# Patient Record
Sex: Female | Born: 1960 | Hispanic: Yes | Marital: Married | Smoking: Former smoker
Health system: Southern US, Community
[De-identification: ages and names within clinical notes are randomized; demographics above are authoritative.]

## PROBLEM LIST (undated history)

## (undated) DIAGNOSIS — F419 Anxiety disorder, unspecified: Secondary | ICD-10-CM

## (undated) DIAGNOSIS — E119 Type 2 diabetes mellitus without complications: Secondary | ICD-10-CM

## (undated) DIAGNOSIS — I10 Essential (primary) hypertension: Secondary | ICD-10-CM

## (undated) HISTORY — PX: BREAST SURGERY: SHX581

## (undated) HISTORY — DX: Anxiety disorder, unspecified: F41.9

## (undated) HISTORY — DX: Type 2 diabetes mellitus without complications: E11.9

## (undated) HISTORY — DX: Essential (primary) hypertension: I10

---

## 2020-06-05 HISTORY — PX: MASTECTOMY: SHX3

## 2021-08-16 ENCOUNTER — Encounter: Payer: Self-pay | Admitting: Nurse Practitioner

## 2021-08-16 ENCOUNTER — Other Ambulatory Visit (HOSPITAL_COMMUNITY): Payer: Self-pay

## 2021-08-16 ENCOUNTER — Ambulatory Visit (INDEPENDENT_AMBULATORY_CARE_PROVIDER_SITE_OTHER): Payer: Self-pay | Admitting: Nurse Practitioner

## 2021-08-16 ENCOUNTER — Other Ambulatory Visit: Payer: Self-pay

## 2021-08-16 VITALS — BP 123/71 | HR 73 | Temp 97.9°F | Ht 62.0 in | Wt 187.6 lb

## 2021-08-16 DIAGNOSIS — E7849 Other hyperlipidemia: Secondary | ICD-10-CM

## 2021-08-16 DIAGNOSIS — Z0000 Encounter for general adult medical examination without abnormal findings: Secondary | ICD-10-CM

## 2021-08-16 DIAGNOSIS — I10 Essential (primary) hypertension: Secondary | ICD-10-CM

## 2021-08-16 DIAGNOSIS — E119 Type 2 diabetes mellitus without complications: Secondary | ICD-10-CM

## 2021-08-16 DIAGNOSIS — G4709 Other insomnia: Secondary | ICD-10-CM

## 2021-08-16 DIAGNOSIS — Z853 Personal history of malignant neoplasm of breast: Secondary | ICD-10-CM

## 2021-08-16 LAB — POCT URINALYSIS DIP (CLINITEK)
Bilirubin, UA: NEGATIVE
Blood, UA: NEGATIVE
Glucose, UA: NEGATIVE mg/dL
Ketones, POC UA: NEGATIVE mg/dL
Leukocytes, UA: NEGATIVE
Nitrite, UA: NEGATIVE
POC PROTEIN,UA: NEGATIVE
Spec Grav, UA: 1.02 (ref 1.010–1.025)
Urobilinogen, UA: 0.2 E.U./dL
pH, UA: 6.5 (ref 5.0–8.0)

## 2021-08-16 LAB — POCT GLYCOSYLATED HEMOGLOBIN (HGB A1C)
HbA1c POC (<> result, manual entry): 6.2 % (ref 4.0–5.6)
HbA1c, POC (controlled diabetic range): 6.2 % (ref 0.0–7.0)
HbA1c, POC (prediabetic range): 6.2 % (ref 5.7–6.4)
Hemoglobin A1C: 6.2 % — AB (ref 4.0–5.6)

## 2021-08-16 MED ORDER — ATORVASTATIN CALCIUM 40 MG PO TABS
40.0000 mg | ORAL_TABLET | Freq: Every day | ORAL | 2 refills | Status: AC
  Filled 2021-08-16: qty 30, 30d supply, fill #0

## 2021-08-16 MED ORDER — HYDROCHLOROTHIAZIDE 12.5 MG PO TABS
12.5000 mg | ORAL_TABLET | Freq: Every day | ORAL | 2 refills | Status: AC
  Filled 2021-08-16: qty 30, 30d supply, fill #0

## 2021-08-16 MED ORDER — TRAZODONE HCL 100 MG PO TABS
100.0000 mg | ORAL_TABLET | Freq: Every day | ORAL | 2 refills | Status: AC
  Filled 2021-08-16: qty 30, 30d supply, fill #0

## 2021-08-16 MED ORDER — METFORMIN HCL 1000 MG PO TABS
1000.0000 mg | ORAL_TABLET | Freq: Two times a day (BID) | ORAL | 2 refills | Status: AC
  Filled 2021-08-16: qty 60, 30d supply, fill #0

## 2021-08-16 MED ORDER — CAPTOPRIL 25 MG PO TABS
25.0000 mg | ORAL_TABLET | Freq: Three times a day (TID) | ORAL | 2 refills | Status: DC
Start: 2021-08-16 — End: 2021-08-17
  Filled 2021-08-16: qty 90, 30d supply, fill #0

## 2021-08-16 NOTE — Patient Instructions (Signed)
You were seen today in the [facility] to establish care and for wellness visit. Labs were collected, results will be available via MyChart or, if abnormal, you will be contacted by clinic staff. You were prescribed medications, please take as directed. Please follow up in 3 mths for reevaluation of chronic illness.  ?

## 2021-08-16 NOTE — Progress Notes (Signed)
? ?[place] ?[place], [place]  [address] ?Phone:  [phone]   Fax:  [phone] ?Subjective:  ? Patient ID: Amber Whitaker, female    DOB: Apr 11, 1961, 61 y.o.   MRN: [id number] ? ?Chief Complaint  ?Patient presents with  ? Establish Care  ?  Pt is here to establish care. Pt stated she had mastectomy done 6 months ago in [place]   ? ?HPI ?Amber Whitaker 61 y.o. female  has a past medical history of Anxiety, Diabetes mellitus without complication ([place]), and Hypertension. History of breast cancer with completion of mastectomy 6 mths ago. To the [facility] to establish care, recently moved to the area from [place] 2-3 wks. Last visit with PCP 1-2 mths ago.  ? ?States that she moved to area for work and benefits of current job. 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 Denies any completion of radiation or chemotherapy after mastectomy.  ? ?Currently compliant with all medications. Checks B/P regularly at home, typically 120-140/80. States that she monitors meals and exercises regularly, walking daily. Requesting referral for oncologist. States that she has had difficulty finding an oncologist with spanish accommodations. Verbalizes also being due for mammogram, requesting referral. Denies any other concerns today.  ? ?Denies any fever. Denies any fatigue, chest pain, shortness of breath, HA or dizziness. Denies any blurred vision, numbness or tingling. ? ?Past Medical History:  ?Diagnosis Date  ? Anxiety   ? Diabetes mellitus without complication ([place])   ? Hypertension   ? ? ?History reviewed. No pertinent surgical history. ? ?Family History  ?Problem Relation Age of Onset  ? Cancer Sister   ? Cancer Brother   ? ? ?Social History  ? ?Socioeconomic History  ? Marital status: Married  ?  Spouse name: Not on file  ? Number of children: Not on file  ? Years of education: Not on file  ? Highest education level: Not on file   ?Occupational History  ? Not on file  ?Tobacco Use  ? Smoking status: Never  ? Smokeless tobacco: Never  ?Vaping Use  ? Vaping Use: Never used  ?Substance and Sexual Activity  ? Alcohol use: Not on file  ? Drug use: Never  ? Sexual activity: Not Currently  ?Other Topics Concern  ? Not on file  ?Social History Narrative  ? Not on file  ? ?Social Determinants of Health  ? ?Financial Resource Strain: Not on file  ?Food Insecurity: Not on file  ?Transportation Needs: Not on file  ?Physical Activity: Not on file  ?Stress: Not on file  ?Social Connections: Not on file  ?Intimate Partner Violence: Not on file  ? ? ?Outpatient Medications Prior to Visit  ?Medication Sig Dispense Refill  ? atorvastatin (LIPITOR) 40 MG tablet Take 40 mg by mouth daily.    ? captopril (CAPOTEN) 25 MG tablet Take 25 mg by mouth 3 (three) times daily.    ? hydrochlorothiazide (HYDRODIURIL) 12.5 MG tablet Take 12.5 mg by mouth daily.    ? metFORMIN (GLUCOPHAGE) 1000 MG tablet Take 1,000 mg by mouth 2 (two) times daily with a meal.    ? traZODone (DESYREL) 100 MG tablet Take 100 mg by mouth at bedtime.    ? ?No facility-administered medications prior to visit.  ? ? ?Not on File ? ?Review of Systems  ?Constitutional:  Negative for chills, fever and malaise/fatigue.  ?HENT: Negative.    ?Eyes: Negative.   ?Respiratory:  Negative for cough and shortness of breath.   ?Cardiovascular:  Negative for chest pain, palpitations and leg swelling.  ?Gastrointestinal:  Negative for abdominal pain, blood in stool, constipation, diarrhea, nausea and vomiting.  ?Genitourinary: Negative.   ?Musculoskeletal: Negative.   ?Skin: Negative.   ?Neurological: Negative.   ?Psychiatric/Behavioral:  Negative for depression. The patient is not nervous/anxious.   ?All other systems reviewed and are negative. ? ?   ?Objective:  ?  ?Physical Exam ?Vitals reviewed.  ?Constitutional:   ?   General: She is not in acute distress. ?   Appearance: Normal appearance. She is normal  weight.  ?HENT:  ?   Head: Normocephalic.  ?   Right Ear: Tympanic membrane, ear canal and external ear normal.  ?   Left Ear: Tympanic membrane, ear canal and external ear normal.  ?   Nose: Nose normal. No congestion or rhinorrhea.  ?   Mouth/Throat:  ?   Mouth: Mucous membranes are moist.  ?   Pharynx: Oropharynx is clear. No oropharyngeal exudate or posterior oropharyngeal erythema.  ?Eyes:  ?   General: No scleral icterus.    ?   Right eye: No discharge.     ?   Left eye: No discharge.  ?   Extraocular Movements: Extraocular movements intact.  ?   Conjunctiva/sclera: Conjunctivae normal.  ?   Pupils: Pupils are equal, round, and reactive to light.  ?Neck:  ?   Vascular: No carotid bruit.  ?Cardiovascular:  ?   Rate and Rhythm: Normal rate and regular rhythm.  ?   Pulses: Normal pulses.  ?   Heart sounds: Normal heart sounds.  ?   Comments: No obvious peripheral edema ?Pulmonary:  ?   Effort: Pulmonary effort is normal.  ?   Breath sounds: Normal breath sounds.  ?Chest:  ?Breasts: ?   Right: Normal. No swelling, bleeding, inverted nipple, mass, nipple discharge, skin change or tenderness.  ?   Left: Absent.  ?   Comments: Overall breast exam wnl ?Abdominal:  ?   General: Abdomen is flat. Bowel sounds are normal. There is no distension.  ?   Palpations: Abdomen is soft. There is no mass.  ?   Tenderness: There is no abdominal tenderness. There is no right CVA tenderness, left CVA tenderness, guarding or rebound.  ?   Hernia: No hernia is present.  ?Musculoskeletal:     ?   General: No swelling, tenderness, deformity or signs of injury. Normal range of motion.  ?   Cervical back: Normal range of motion and neck supple. No rigidity or tenderness.  ?   Right lower leg: No edema.  ?   Left lower leg: No edema.  ?Lymphadenopathy:  ?   Cervical: No cervical adenopathy.  ?Skin: ?   General: Skin is warm and dry.  ?   Capillary Refill: Capillary refill takes less than 2 seconds.  ?Neurological:  ?   General: No focal  deficit present.  ?   Mental Status: She is alert and oriented to person, place, and time.  ?Psychiatric:     ?   Mood and Affect: Mood normal.     ?   Behavior: Behavior normal.     ?   Thought Content: Thought content normal.     ?   Judgment: Judgment normal.  ? ? ?BP 123/71   Pulse 73   Temp 97.9 ?F (36.6 ?C)   Ht 5' 2"  (1.575 m)   Wt 187 lb  9.6 oz (85.1 kg)   SpO2 100%   BMI 34.31 kg/m?  ?Wt Readings from Last 3 Encounters:  ?08/16/21 187 lb 9.6 oz (85.1 kg)  ? ? ?Immunization History  ?Administered Date(s) Administered  ? Unspecified SARS-COV-2 Vaccination 10/15/2019  ? ? ?Diabetic Foot Exam - Simple   ?No data filed ?  ? ? ?No results found for: TSH ?No results found for: WBC, HGB, HCT, MCV, PLT ?No results found for: NA, K, CHLORIDE, CO2, GLUCOSE, BUN, CREATININE, BILITOT, ALKPHOS, AST, ALT, PROT, ALBUMIN, CALCIUM, ANIONGAP, EGFR, GFR ?No results found for: CHOL ?No results found for: HDL ?No results found for: [place] ?No results found for: TRIG ?No results found for: CHOLHDL ?Lab Results  ?Component Value Date  ? HGBA1C 6.2 (A) 08/16/2021  ? HGBA1C 6.2 08/16/2021  ? HGBA1C 6.2 08/16/2021  ? HGBA1C 6.2 08/16/2021  ? ? ?   ?Assessment & Plan:  ? ?Problem List Items Addressed This Visit   ?None ?Visit Diagnoses   ? ? History of breast cancer    -  Primary  ? Relevant Orders  ? Ambulatory referral to Gynecologic Oncology  ? POCT glycosylated hemoglobin (Hb A1C) (Completed): 6.2, at goal   ? POCT URINALYSIS DIP (CLINITEK) (Completed): wnl  ? MM Digital Diagnostic Bilat  ? Encounter for wellness examination in adult      ? Relevant Orders  ? POCT glycosylated hemoglobin (Hb A1C) (Completed)  ? POCT URINALYSIS DIP (CLINITEK) (Completed)  ? CBC with Differential/Platelet  ? Comprehensive metabolic panel  ? Lipid panel ?Encouraged continued diet and exercise efforts  ?Encouraged continued compliance with medication  ?  ? Primary hypertension      ? Relevant Medications  ? atorvastatin (LIPITOR) 40 MG tablet   ? captopril (CAPOTEN) 25 MG tablet  ? hydrochlorothiazide (HYDRODIURIL) 12.5 MG tablet  ? Other Relevant Orders  ? CBC with Differential/Platelet  ? Comprehensive metabolic panel  ? Lipid panel ?Encouraged continued d

## 2021-08-17 ENCOUNTER — Other Ambulatory Visit (HOSPITAL_COMMUNITY): Payer: Self-pay

## 2021-08-17 ENCOUNTER — Other Ambulatory Visit: Payer: Self-pay | Admitting: Nurse Practitioner

## 2021-08-17 DIAGNOSIS — I10 Essential (primary) hypertension: Secondary | ICD-10-CM

## 2021-08-17 LAB — COMPREHENSIVE METABOLIC PANEL
ALT: 31 IU/L (ref 0–32)
AST: 27 IU/L (ref 0–40)
Albumin/Globulin Ratio: 2.1 (ref 1.2–2.2)
Albumin: 4.8 g/dL (ref 3.8–4.9)
Alkaline Phosphatase: 138 IU/L — ABNORMAL HIGH (ref 44–121)
BUN/Creatinine Ratio: 19 (ref 12–28)
BUN: 13 mg/dL (ref 8–27)
Bilirubin Total: 0.3 mg/dL (ref 0.0–1.2)
CO2: 24 mmol/L (ref 20–29)
Calcium: 9.4 mg/dL (ref 8.7–10.3)
Chloride: 102 mmol/L (ref 96–106)
Creatinine, Ser: 0.68 mg/dL (ref 0.57–1.00)
Globulin, Total: 2.3 g/dL (ref 1.5–4.5)
Glucose: 92 mg/dL (ref 70–99)
Potassium: 4.5 mmol/L (ref 3.5–5.2)
Sodium: 140 mmol/L (ref 134–144)
Total Protein: 7.1 g/dL (ref 6.0–8.5)
eGFR: 100 mL/min/{1.73_m2} (ref >59)

## 2021-08-17 LAB — CBC WITH DIFFERENTIAL/PLATELET
Basophils Absolute: 0 10*3/uL (ref 0.0–0.2)
Basos: 1 %
EOS (ABSOLUTE): 0.2 10*3/uL (ref 0.0–0.4)
Eos: 3 %
Hematocrit: 39.3 % (ref 34.0–46.6)
Hemoglobin: 13.3 g/dL (ref 11.1–15.9)
Immature Grans (Abs): 0 10*3/uL (ref 0.0–0.1)
Immature Granulocytes: 0 %
Lymphocytes Absolute: 2.1 10*3/uL (ref 0.7–3.1)
Lymphs: 37 %
MCH: 28.9 pg (ref 26.6–33.0)
MCHC: 33.8 g/dL (ref 31.5–35.7)
MCV: 85 fL (ref 79–97)
Monocytes Absolute: 0.5 10*3/uL (ref 0.1–0.9)
Monocytes: 8 %
Neutrophils Absolute: 3 10*3/uL (ref 1.4–7.0)
Neutrophils: 51 %
Platelets: 238 10*3/uL (ref 150–450)
RBC: 4.61 x10E6/uL (ref 3.77–5.28)
RDW: 13.1 % (ref 11.7–15.4)
WBC: 5.8 10*3/uL (ref 3.4–10.8)

## 2021-08-17 LAB — LIPID PANEL
Chol/HDL Ratio: 4.6 ratio — ABNORMAL HIGH (ref 0.0–4.4)
Cholesterol, Total: 185 mg/dL (ref 100–199)
HDL: 40 mg/dL (ref >39)
LDL Chol Calc (NIH): 122 mg/dL — ABNORMAL HIGH (ref 0–99)
Triglycerides: 129 mg/dL (ref 0–149)
VLDL Cholesterol Cal: 23 mg/dL (ref 5–40)

## 2021-08-17 MED ORDER — LISINOPRIL 40 MG PO TABS
40.0000 mg | ORAL_TABLET | Freq: Every day | ORAL | 3 refills | Status: AC
  Filled 2021-08-17: qty 90, 90d supply, fill #0

## 2021-08-17 MED ORDER — CAPTOPRIL 25 MG PO TABS
25.0000 mg | ORAL_TABLET | Freq: Three times a day (TID) | ORAL | 2 refills | Status: DC
  Filled 2021-08-17: qty 90, 30d supply, fill #0

## 2021-08-18 ENCOUNTER — Telehealth: Payer: Self-pay | Admitting: Clinical

## 2021-08-18 ENCOUNTER — Telehealth: Payer: Self-pay | Admitting: Hematology and Oncology

## 2021-08-18 NOTE — Telephone Encounter (Signed)
Scheduled appt per 3/14 referral. Pt is aware of appt date and time. Pt is aware to arrive 15 mins prior to appt time and to bring and updated insurance card. Pt is aware of appt location.   ?

## 2021-08-18 NOTE — Telephone Encounter (Signed)
[facility] ?Case Management Referral Note ? ?08/18/2021 ?Name: [name] MRN: [id number] DOB: 1960-08-05 ?Amber Whitaker is a 61 y.o. year old female who sees No primary care provider on file. for primary care. LCSW was consulted to assess patient's needs and assist the patient with Financial Difficulties related to low income and lack of health coverage . ? ?Interpreter: Yes.     Interpreter Name & Language: Spanish [phone] ? ?Assessment: Patient experiencing Financial Difficulties related to low income and lack of health coverage.  ? ?Intervention: CSW called patient to follow up on patient's visit with PCP 08/16/21. Patient is uninsured and has low income. She has been living in [place] for six months. Reviewed [employer] and [employer]. Advised patient on supporting documents to be submitted with the application. Advised patient to follow up with CSW for assistance in scheduling appointment with financial counselor at [facility]) to submit the application. Provided CSW contact information. Also reviewed mammogram scholarship application and instructions on submitting, as patient is in need of a mammogram. ? ?Review of patient status, including review of consultants reports, relevant laboratory and other test results, and collaboration with appropriate care team members and the patient's provider was performed as part of comprehensive patient evaluation and provision of services.   ? ?Abigail Butts, LCSW ?Patient [facility] Center ?[facility] ?[phone] ?  ? ?

## 2021-08-26 ENCOUNTER — Other Ambulatory Visit (HOSPITAL_COMMUNITY): Payer: Self-pay

## 2021-09-05 NOTE — Progress Notes (Signed)
[facility] ?CONSULT NOTE ? ?No care team member to display ? ?CHIEF COMPLAINTS/PURPOSE OF CONSULTATION:  ?Newly diagnosed breast cancer High risk ? ?HISTORY OF PRESENTING ILLNESS:  ?Amber Whitaker 61 y.o. female is here because of recent diagnosis of left High risk breast cancer. She presents to the clinic today for a consult.  She was diagnosed with left breast DCIS about a year ago in [place].  She underwent left mastectomy.  Because she was ER/PR negative there was no role for antiestrogen therapy.  She moved to [facility] for work and is here today to establish oncology care with [place].  She reports no new pain or discomfort. ? ?I reviewed her records extensively and collaborated the history with the patient. ? ?MEDICAL HISTORY:  ?Past Medical History:  ?Diagnosis Date  ? Anxiety   ? Diabetes mellitus without complication ([place])   ? Hypertension   ? ? ?SURGICAL HISTORY: ?No past surgical history on file. ? ?SOCIAL HISTORY: ?Social History  ? ?Socioeconomic History  ? Marital status: Married  ?  Spouse name: Not on file  ? Number of children: Not on file  ? Years of education: Not on file  ? Highest education level: Not on file  ?Occupational History  ? Not on file  ?Tobacco Use  ? Smoking status: Never  ? Smokeless tobacco: Never  ?Vaping Use  ? Vaping Use: Never used  ?Substance and Sexual Activity  ? Alcohol use: Not on file  ? Drug use: Never  ? Sexual activity: Not Currently  ?Other Topics Concern  ? Not on file  ?Social History Narrative  ? Not on file  ? ?Social Determinants of Health  ? ?Financial Resource Strain: Not on file  ?Food Insecurity: Not on file  ?Transportation Needs: Not on file  ?Physical Activity: Not on file  ?Stress: Not on file  ?Social Connections: Not on file  ?Intimate Partner Violence: Not on file  ? ? ?FAMILY HISTORY: ?Family History  ?Problem Relation Age of Onset  ? Cancer Sister   ? Cancer Brother   ? ? ?ALLERGIES:  has no allergies on  file. ? ?MEDICATIONS:  ?Current Outpatient Medications  ?Medication Sig Dispense Refill  ? atorvastatin (LIPITOR) 40 MG tablet Take 1 tablet (40 mg total) by mouth daily. 30 tablet 2  ? hydrochlorothiazide (HYDRODIURIL) 12.5 MG tablet Take 1 tablet (12.5 mg total) by mouth daily. 30 tablet 2  ? lisinopril (ZESTRIL) 40 MG tablet Take 1 tablet (40 mg total) by mouth daily. 90 tablet 3  ? metFORMIN (GLUCOPHAGE) 1000 MG tablet Take 1 tablet (1,000 mg total) by mouth 2 (two) times daily with a meal. 60 tablet 2  ? traZODone (DESYREL) 100 MG tablet Take 1 tablet (100 mg total) by mouth at bedtime. 30 tablet 2  ? ?No current facility-administered medications for this visit.  ? ? ?REVIEW OF SYSTEMS:   ?Constitutional: Denies fevers, chills or abnormal night sweats ?Eyes: Denies blurriness of vision, double vision or watery eyes ?Ears, nose, mouth, throat, and face: Denies mucositis or sore throat ?Respiratory: Denies cough, dyspnea or wheezes ?Cardiovascular: Denies palpitation, chest discomfort or lower extremity swelling ?Gastrointestinal:  Denies nausea, heartburn or change in bowel habits ?Skin: Denies abnormal skin rashes ?Lymphatics: Denies new lymphadenopathy or easy bruising ?Neurological:Denies numbness, tingling or new weaknesses ?Behavioral/Psych: Mood is stable, no new changes  ?Breast:  Denies any palpable lumps or discharge ?All other systems were reviewed with the patient and are negative. ? ?PHYSICAL EXAMINATION: ?ECOG  PERFORMANCE STATUS: 1 - Symptomatic but completely ambulatory ? ?Vitals:  ? 09/06/21 1314  ?BP: (!) 151/87  ?Pulse: 73  ?Resp: 18  ?Temp: (!) 97.5 ?F (36.4 ?C)  ?SpO2: 99%  ? ?Filed Weights  ? 09/06/21 1314  ?Weight: 186 lb 14.4 oz (84.8 kg)  ? ? ?GENERAL:alert, no distress and comfortable ?SKIN: skin color, texture, turgor are normal, no rashes or significant lesions ?EYES: normal, conjunctiva are pink and non-injected, sclera clear ?OROPHARYNX:no exudate, no erythema and lips, buccal mucosa,  and tongue normal  ?NECK: supple, thyroid normal size, non-tender, without nodularity ?LYMPH:  no palpable lymphadenopathy in the cervical, axillary or inguinal ?LUNGS: clear to auscultation and percussion with normal breathing effort ?HEART: regular rate & rhythm and no murmurs and no lower extremity edema ?ABDOMEN:abdomen soft, non-tender and normal bowel sounds ?Musculoskeletal:no cyanosis of digits and no clubbing  ?PSYCH: alert & oriented x 3 with fluent speech ?NEURO: no focal motor/sensory deficits ?BREAST: No palpable nodules in right breast or left chest wall or axilla. No palpable axillary or supraclavicular lymphadenopathy (exam performed in the presence of a chaperone)  ? ?LABORATORY DATA:  ?I have reviewed the data as listed ?Lab Results  ?Component Value Date  ? WBC 5.8 08/16/2021  ? HGB 13.3 08/16/2021  ? HCT 39.3 08/16/2021  ? MCV 85 08/16/2021  ? PLT 238 08/16/2021  ? ?Lab Results  ?Component Value Date  ? NA 140 08/16/2021  ? K 4.5 08/16/2021  ? CL 102 08/16/2021  ? CO2 24 08/16/2021  ? ? ?RADIOGRAPHIC STUDIES: ?I have personally reviewed the radiological reports and agreed with the findings in the report. ? ?ASSESSMENT AND PLAN:  ?Ductal carcinoma in situ (DCIS) of left breast ?09/16/2020: Biopsy of 7 cm of pleomorphic calcifications in the left breast: DCIS ?10/15/2020: Myriad my risk: Negative ?11/11/2020: Left mastectomy by Dr. Addison Lank [facility] [place]: Grade 3 DCIS 1.2 cm with comedonecrosis, margins negative, ER less than 1%, PR less than 1% ?She went to [place] to [place] from work.  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 ? ?No role of radiation or antiestrogen therapy ?Significant family history of breast cancer 1 sister died at age 51 from breast cancer another older sister is a breast cancer survivor.  Patient is menopausal and does not use hormone replacement therapy. ?Genetic testing: Negative (she has 4 family members with breast  cancers) ? ?Breast cancer surveillance: ?1.  Mammogram of the right breast: Will be ordered for May ?2. breast exam 09/06/2021: Benign ? ?Return to clinic in 1 year for follow-up ? ? ? ?All questions were answered. The patient knows to call the clinic with any problems, questions or concerns. ?  ? Harriette Ohara, MD ?09/06/21 ? I Gardiner Coins am scribing for Dr. Lindi Adie ? ?I have reviewed the above documentation for accuracy and completeness, and I agree with the above. ?  ?

## 2021-09-06 ENCOUNTER — Inpatient Hospital Stay: Payer: Self-pay | Attending: Hematology and Oncology | Admitting: Hematology and Oncology

## 2021-09-06 ENCOUNTER — Other Ambulatory Visit: Payer: Self-pay

## 2021-09-06 DIAGNOSIS — D0512 Intraductal carcinoma in situ of left breast: Secondary | ICD-10-CM | POA: Insufficient documentation

## 2021-09-06 DIAGNOSIS — Z86000 Personal history of in-situ neoplasm of breast: Secondary | ICD-10-CM | POA: Insufficient documentation

## 2021-09-06 NOTE — Assessment & Plan Note (Signed)
09/16/2020: Biopsy of 7 cm of pleomorphic calcifications in the left breast: DCIS ?10/15/2020: Myriad my risk: Negative ?11/11/2020: Left mastectomy by Dr. Addison Lank [facility] [place]: Grade 3 DCIS 1.2 cm with comedonecrosis, margins negative, ER less than 1%, PR less than 1% ? ?No role of radiation or antiestrogen therapy ?Significant family history of breast cancer 1 sister died at age 60 from breast cancer another older sister is a breast cancer survivor.  Patient is menopausal and does not use hormone replacement therapy. ?Genetic testing: Negative ? ?Breast cancer surveillance: ?1.  Mammogram of the right breast: ?2. breast exam 09/06/2021: Benign ? ?Return to clinic in 1 year for follow-up ? ? ?

## 2021-09-08 ENCOUNTER — Other Ambulatory Visit: Payer: Self-pay | Admitting: *Deleted

## 2021-09-08 ENCOUNTER — Other Ambulatory Visit: Payer: Self-pay

## 2021-09-08 DIAGNOSIS — Z86000 Personal history of in-situ neoplasm of breast: Secondary | ICD-10-CM

## 2021-09-15 ENCOUNTER — Other Ambulatory Visit: Payer: Self-pay | Admitting: *Deleted

## 2021-09-16 ENCOUNTER — Ambulatory Visit: Payer: Self-pay | Admitting: Clinical

## 2021-09-16 DIAGNOSIS — Z5989 Other problems related to housing and economic circumstances: Secondary | ICD-10-CM

## 2021-09-16 NOTE — Progress Notes (Signed)
[facility] ?General Follow Up Note ? ?09/16/2021 ?Name: Sheyli Horwitz MRN: [id number] DOB: [birth year] ?Ladasia Sircy is a 61 y.o. year old female who sees No primary care provider on file. for primary care. LCSW was initially consulted to Financial Difficulties related to low income and lack of health coverage . ? ?Interpreter: No.   Interpreter Name & Language: none ? ?Assessment: Patient experiencing financial difficulties due to lack of health coverage. ? ?Ongoing Intervention: Today CSW and patient met at the [facility]). Patient brought her CAFA application and supporting documents. Reviewed these with patient. CSW to submit.  ? ?SDOH (Social Determinants of Health) assessments performed: No ?  ? ?Review of patient status, including review of consultants reports, relevant laboratory and other test results, and collaboration with appropriate care team members and the patient's provider was performed as part of comprehensive patient evaluation and provision of services.   ? ?Estanislado Emms, LCSW ?Patient [place] ?[place] ?[phone] ?  ? ?

## 2021-10-20 ENCOUNTER — Other Ambulatory Visit: Payer: Self-pay

## 2021-10-20 DIAGNOSIS — Z1231 Encounter for screening mammogram for malignant neoplasm of breast: Secondary | ICD-10-CM

## 2021-10-27 ENCOUNTER — Ambulatory Visit: Payer: Self-pay | Admitting: *Deleted

## 2021-10-27 ENCOUNTER — Ambulatory Visit
Admission: RE | Admit: 2021-10-27 | Discharge: 2021-10-27 | Disposition: A | Discharge status: Home / Self Care | Payer: No Typology Code available for payment source | Source: Ambulatory Visit | Attending: Nurse Practitioner | Admitting: Nurse Practitioner

## 2021-10-27 VITALS — BP 120/82 | Wt 185.7 lb

## 2021-10-27 DIAGNOSIS — Z853 Personal history of malignant neoplasm of breast: Secondary | ICD-10-CM

## 2021-10-27 DIAGNOSIS — Z1239 Encounter for other screening for malignant neoplasm of breast: Secondary | ICD-10-CM

## 2021-10-27 DIAGNOSIS — Z1211 Encounter for screening for malignant neoplasm of colon: Secondary | ICD-10-CM

## 2021-10-27 NOTE — Patient Instructions (Addendum)
Explained breast self awareness with Amber Whitaker. Patient did not need a Pap smear today due to last Pap smear was in March 2022 per patient. Let her know BCCCP will cover Pap smears every 3 years unless has a history of abnormal Pap smears. Referred patient to the [facility] for a screening mammogram on mobile unit. Appointment scheduled Thursday, Oct 27, 2021 at 1300. Patient aware of appointment and will be there. Let patient know the Breast Center will follow up with her within the next couple weeks with results of mammogram by letter or phone. Amber Whitaker verbalized understanding.  Amber Whitaker, Kathaleen Maser, RN 11:31 AM

## 2021-10-27 NOTE — Progress Notes (Signed)
[name] is a 61 y.o. female who presents to [facility] clinic today with no complaints.    Pap Smear: Pap smear not completed today. Last Pap smear was in March 2022 at a clinic in [place] and was normal per patient. Per patient has no history of an abnormal Pap smear. Last Pap smear result is not available in Epic.   Physical exam: Breasts Right breast larger than left breast due to patient has a history of a left breast mastectomy for breast cancer in June 2022. No skin abnormalities bilateral breasts. Right nipple inverted that per patient is normal for her. No nipple discharge right breast.  No lymphadenopathy. No lumps palpated bilateral breasts. No complaints of pain or tenderness on exam.     Pelvic/Bimanual Pap is not indicated today per BCCCP guidelines.   Smoking History: Patient is a former smoker that quit over 10 years ago.   Patient Navigation: Patient education provided. Access to services provided for patient through [facility] program. Spanish interpreter Amber Whitaker from [facility] provided.   Colorectal Cancer Screening: Per patient has never had colonoscopy completed. FIT Test given to patient to complete. No complaints today.    Breast and Cervical Cancer Risk Assessment: Patient has family history of two sisters having breast cancer. Patient has personal history of breast cancer. Patient has no known genetic mutations. Patient stated she had genetic testing completed that was negative. Patient has no history of radiation treatment to the chest before age 5. Patient does not have history of cervical dysplasia, immunocompromised, or DES exposure in-utero.  Risk Assessment     Risk Scores       10/27/2021   Last edited by: Amber Rutherford, LPN   5-year risk: 7.3 %   Lifetime risk: 33.8 %            A: BCCCP exam without pap smear No complaints.  P: Referred patient to the [facility] of [facility] for a screening mammogram on mobile unit.  Appointment scheduled Thursday, Oct 27, 2021 at 1300.  Amber Heidelberg, RN 10/27/2021 11:31 AM

## 2021-11-15 ENCOUNTER — Ambulatory Visit: Payer: Self-pay | Admitting: Nurse Practitioner

## 2022-08-30 NOTE — Progress Notes (Signed)
Patient Care Team: Teena Dunk, NP (Inactive) as PCP - General (Nurse Practitioner)  DIAGNOSIS:  Encounter Diagnosis  Name Primary?   Ductal carcinoma in situ (DCIS) of left breast Yes      CHIEF COMPLIANT:  Breast Cancer Surveillance  INTERVAL HISTORY: Amber Whitaker is a  62 y.o. female is here because of recent diagnosis of left High risk breast cancer. She presents to the clinic today for follow-up. She reports that her health is good. She denies any pain or discomfort in breast. She doesn't get any time for exercise.   ALLERGIES:  has no allergies on file.  MEDICATIONS:  Current Outpatient Medications  Medication Sig Dispense Refill   atorvastatin (LIPITOR) 40 MG tablet Take 1 tablet (40 mg total) by mouth daily. 30 tablet 2   hydrochlorothiazide (HYDRODIURIL) 12.5 MG tablet Take 1 tablet (12.5 mg total) by mouth daily. 30 tablet 2   lisinopril (ZESTRIL) 40 MG tablet Take 1 tablet (40 mg total) by mouth daily. 90 tablet 3   metFORMIN (GLUCOPHAGE) 1000 MG tablet Take 1 tablet (1,000 mg total) by mouth 2 (two) times daily with a meal. 60 tablet 2   traZODone (DESYREL) 100 MG tablet Take 1 tablet (100 mg total) by mouth at bedtime. 30 tablet 2   No current facility-administered medications for this visit.    PHYSICAL EXAMINATION: ECOG PERFORMANCE STATUS: 1 - Symptomatic but completely ambulatory  Vitals:   09/07/22 1404  BP: (!) 156/79  Pulse: 83  Resp: 18  Temp: (!) 97.2 F (36.2 C)  SpO2: 96%   Filed Weights   09/07/22 1404  Weight: 182 lb 14.4 oz (83 kg)    BREAST: No palpable masses or nodules in right breast and left chest wall.  No palpable axillary supraclavicular or infraclavicular adenopathy no breast tenderness or nipple discharge. (exam performed in the presence of a chaperone)  LABORATORY DATA:  I have reviewed the data as listed    Latest Ref Rng & Units 08/16/2021   11:57 AM  CMP  Glucose 70 - 99 mg/dL 92   BUN 8 - 27 mg/dL  13   Creatinine 0.57 - 1.00 mg/dL 0.68   Sodium 134 - 144 mmol/L 140   Potassium 3.5 - 5.2 mmol/L 4.5   Chloride 96 - 106 mmol/L 102   CO2 20 - 29 mmol/L 24   Calcium 8.7 - 10.3 mg/dL 9.4   Total Protein 6.0 - 8.5 g/dL 7.1   Total Bilirubin 0.0 - 1.2 mg/dL 0.3   Alkaline Phos 44 - 121 IU/L 138   AST 0 - 40 IU/L 27   ALT 0 - 32 IU/L 31     Lab Results  Component Value Date   WBC 5.8 08/16/2021   HGB 13.3 08/16/2021   HCT 39.3 08/16/2021   MCV 85 08/16/2021   PLT 238 08/16/2021   NEUTROABS 3.0 08/16/2021    ASSESSMENT & PLAN:  Ductal carcinoma in situ (DCIS) of left breast 09/16/2020: Biopsy of 7 cm of pleomorphic calcifications in the left breast: DCIS 10/15/2020: Myriad my risk: Negative 11/11/2020: Left mastectomy by Dr. Addison Lank [facility] [place]: Grade 3 DCIS 1.2 cm with comedonecrosis, margins negative, ER less than 1%, PR less than 1% She went to [place] to [place] from work.  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   No role of radiation or antiestrogen therapy Significant family history of breast cancer 1 sister died at age 96 from  breast cancer another older sister is a breast cancer survivor.  Patient is menopausal and does not use hormone replacement therapy. Genetic testing: Negative (she has 4 family members with breast cancers)   Breast cancer surveillance: 1.  Mammogram of the right breast: 11/01/2021: Benign breast density category B 2. breast exam 09/07/2022: Benign  She is uninsured and Hispanic and we will request to be set up with BCEP program so that she can get her mammograms covered.   Return to clinic in 1 year for follow-up    No orders of the defined types were placed in this encounter.  The patient has a good understanding of the overall plan. she agrees with it. she will call with any problems that may develop before the next visit here. Total time spent: 30 mins including face to face time and time  spent for planning, charting and co-ordination of care   Harriette Ohara, MD 09/07/22    I Gardiner Coins am acting as a Education administrator for [employer]  I have reviewed the above documentation for accuracy and completeness, and I agree with the above.

## 2022-09-07 ENCOUNTER — Encounter: Payer: Self-pay | Admitting: Hematology and Oncology

## 2022-09-07 ENCOUNTER — Inpatient Hospital Stay: Payer: Self-pay | Attending: Hematology and Oncology | Admitting: Hematology and Oncology

## 2022-09-07 VITALS — BP 156/79 | HR 83 | Temp 97.2°F | Resp 18 | Ht 62.0 in | Wt 182.9 lb

## 2022-09-07 DIAGNOSIS — D0512 Intraductal carcinoma in situ of left breast: Secondary | ICD-10-CM

## 2022-09-07 DIAGNOSIS — Z9012 Acquired absence of left breast and nipple: Secondary | ICD-10-CM | POA: Insufficient documentation

## 2022-09-07 DIAGNOSIS — Z86000 Personal history of in-situ neoplasm of breast: Secondary | ICD-10-CM | POA: Insufficient documentation

## 2022-09-07 DIAGNOSIS — Z803 Family history of malignant neoplasm of breast: Secondary | ICD-10-CM | POA: Insufficient documentation

## 2022-09-07 NOTE — Assessment & Plan Note (Addendum)
09/16/2020: Biopsy of 7 cm of pleomorphic calcifications in the left breast: DCIS 10/15/2020: Myriad my risk: Negative 11/11/2020: Left mastectomy by Dr. Addison Lank [facility] [place]: Grade 3 DCIS 1.2 cm with comedonecrosis, margins negative, ER less than 1%, PR less than 1% She went to [place] to [place] from work.  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   No role of radiation or antiestrogen therapy Significant family history of breast cancer 1 sister died at age 55 from breast cancer another older sister is a breast cancer survivor.  Patient is menopausal and does not use hormone replacement therapy. Genetic testing: Negative (she has 4 family members with breast cancers)   Breast cancer surveillance: 1.  Mammogram of the right breast: 11/01/2021: Benign breast density category B 2. breast exam 09/07/2022: Benign  She is uninsured and Hispanic and we will request to be set up with BCEP program so that she can get her mammograms covered.   Return to clinic in 1 year for follow-up

## 2022-09-07 NOTE — Progress Notes (Signed)
Patient's interpreter called with the patient present to ask about Financial Assistance.  Introduced myself as Arboriculturist. Patient currently does not have an active treatment plan due to having doctor visit today. Advised once treatment plan has been established, I would connect with patient via interpreter to advise what she can apply for as far as treatment should chemotherapy be a part of her treatment plan. If Radiation, there advocates would connect with patient.   Advised all uninsured patients receive an automatic 67% off services billed through [facility]. The Financial Assistance application is to determine if she is eligible for any more than the 67% discount. Advised the application is to be completed and supporting documents submitted with application via mail or can be turned in at cancer center to be mailed as long as all documents are with application to avoid denial which will stand for 6 months if denied.  My card will be provided on day of chemo education class should she be receiving chemotherapy in the future as her treatment plan.  The interpreter verbalized understanding. I advised where to obtain application at cancer center in plastic holders next to side desk for [facility].

## 2022-09-12 ENCOUNTER — Telehealth: Payer: Self-pay

## 2022-09-12 NOTE — Telephone Encounter (Signed)
Telephoned patient at mobile number using interpreter [phone]. Left a voice message with [employer] contact information.

## 2022-10-12 ENCOUNTER — Telehealth: Payer: Self-pay

## 2022-10-12 NOTE — Telephone Encounter (Signed)
Telephoned patient using language line interpreter [phone]. Voice mail not an option.

## 2023-09-13 ENCOUNTER — Ambulatory Visit: Payer: No Typology Code available for payment source | Admitting: Hematology and Oncology

## 2023-09-13 NOTE — Assessment & Plan Note (Deleted)
 09/16/2020: Biopsy of 7 cm of pleomorphic calcifications in the left breast: DCIS 10/15/2020: Myriad my risk: Negative 11/11/2020: Left mastectomy by Dr. Edmund [facility] [place]: Grade 3 DCIS 1.2 cm with comedonecrosis, margins negative, ER less than 1%, PR less than 1% She went to [place] to [place] from work.  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   No role of radiation or antiestrogen therapy Significant family history of breast cancer 1 sister died at age 81 from breast cancer another older sister is a breast cancer survivor.  Patient is menopausal and does not use hormone replacement therapy. Genetic testing: Negative (she has 4 family members with breast cancers)   Breast cancer surveillance: 1.  Mammogram of the right breast: 11/01/2021: Benign breast density category B 2. breast exam 09/13/2023: Benign   She is uninsured and Hispanic and we will request to be set up with BCEP program so that she can get her mammograms covered.   Return to clinic in 1 year for follow-up

## 2024-04-06 IMAGING — MG MM DIGITAL SCREENING UNILAT*R* W/ TOMO W/ CAD
4 series · 4 of 12 positions shown · non-contrast
Comparison: Previous exam(s).

CLINICAL DATA: Screening.

EXAM:
DIGITAL SCREENING UNILATERAL RIGHT MAMMOGRAM WITH CAD AND
TOMOSYNTHESIS
TECHNIQUE: Right screening digital craniocaudal and mediolateral oblique
mammograms were obtained. Right screening digital breast
tomosynthesis was performed. The images were evaluated with
computer-aided detection.

[R MLO synth-2D]
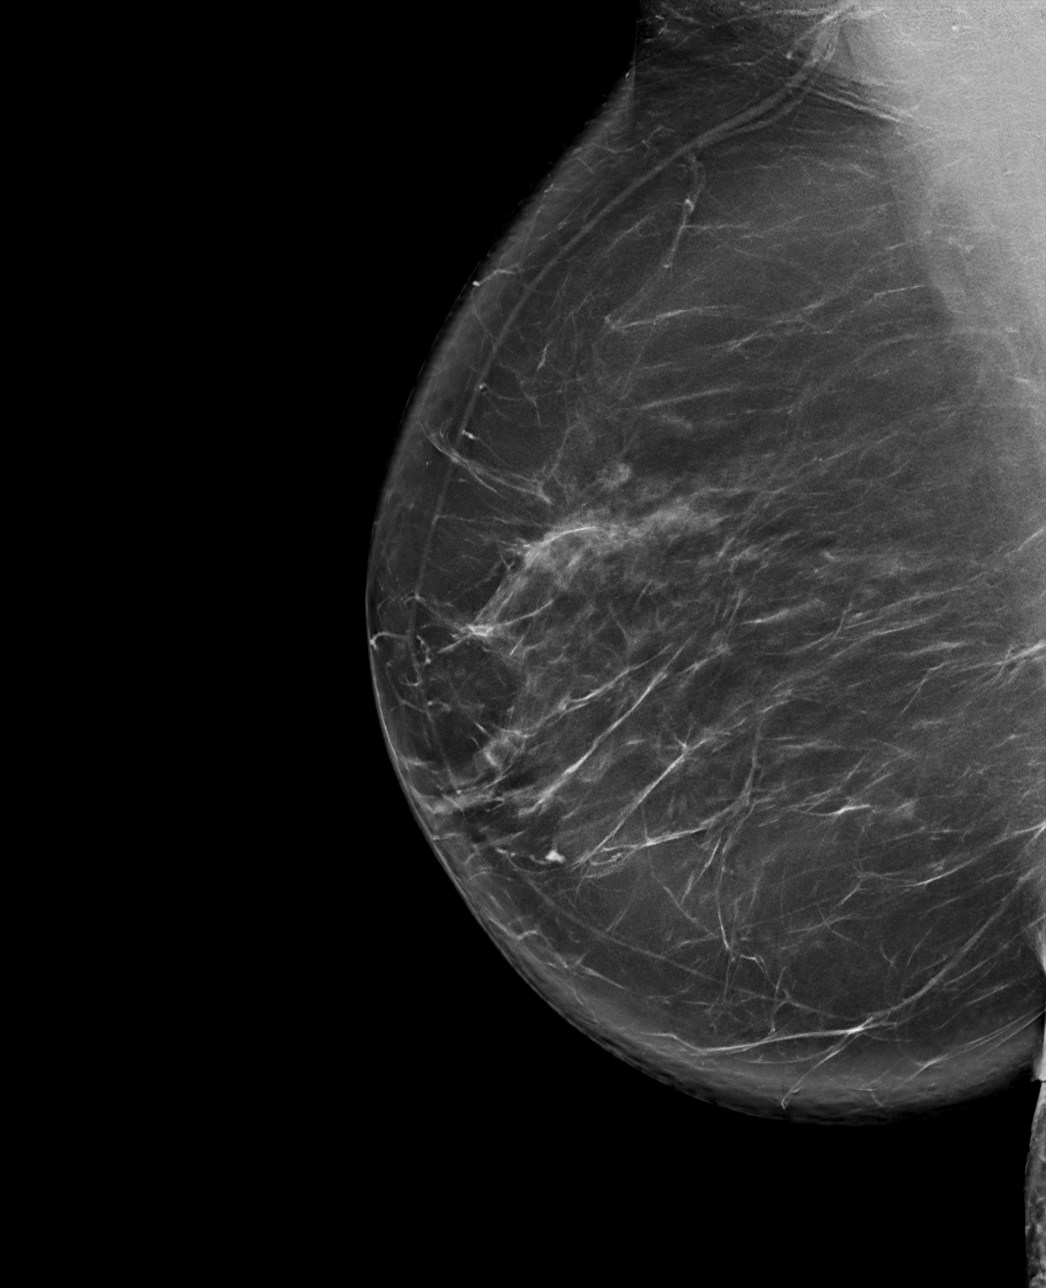

[R CC synth-2D]
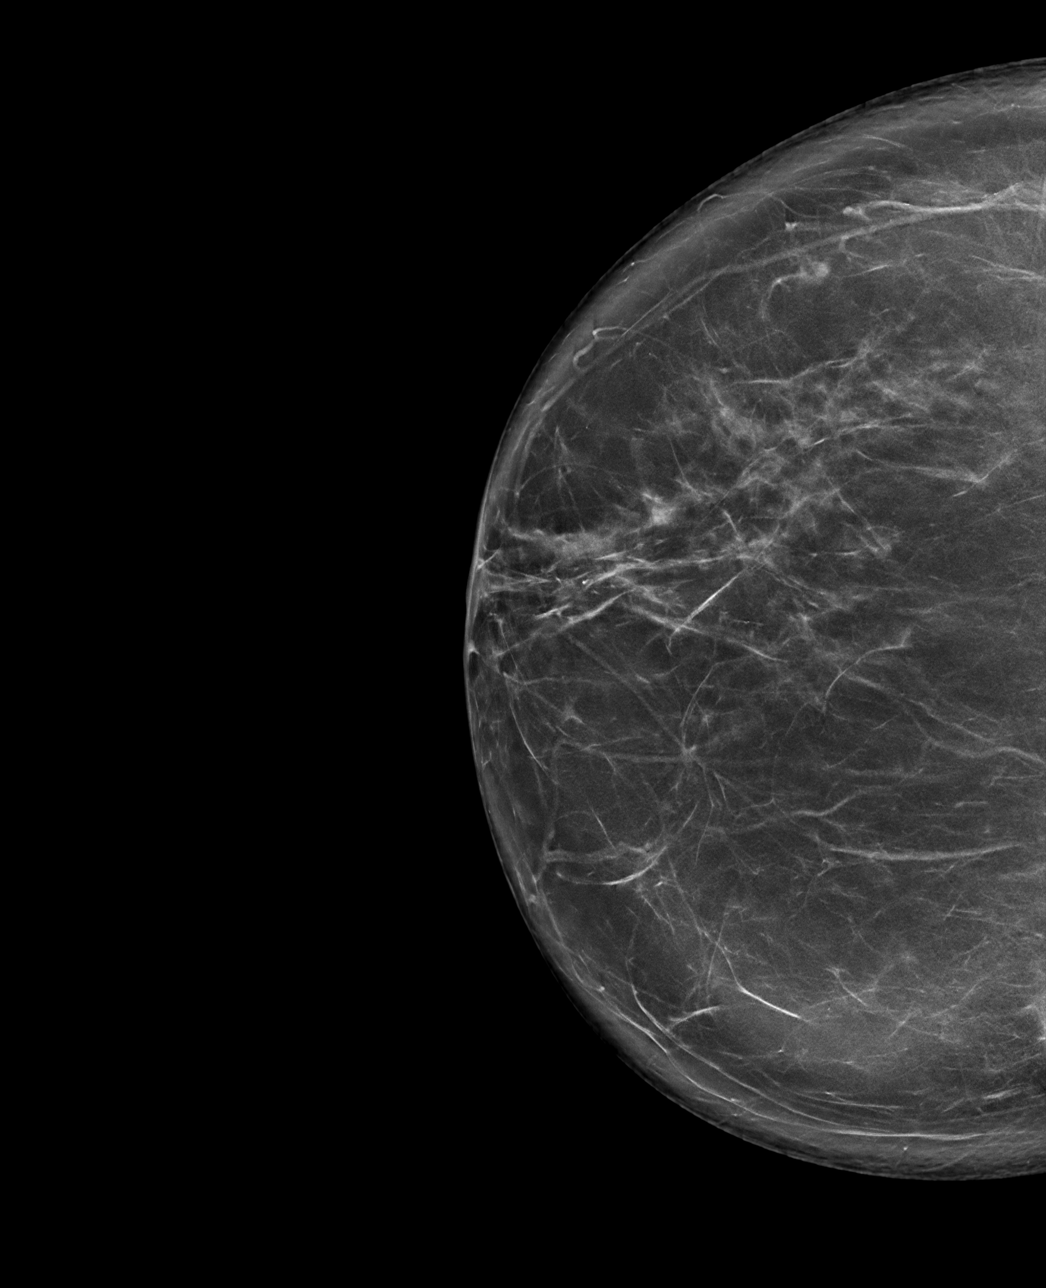

[R MLO tomo · tomo slice 51/101.0]
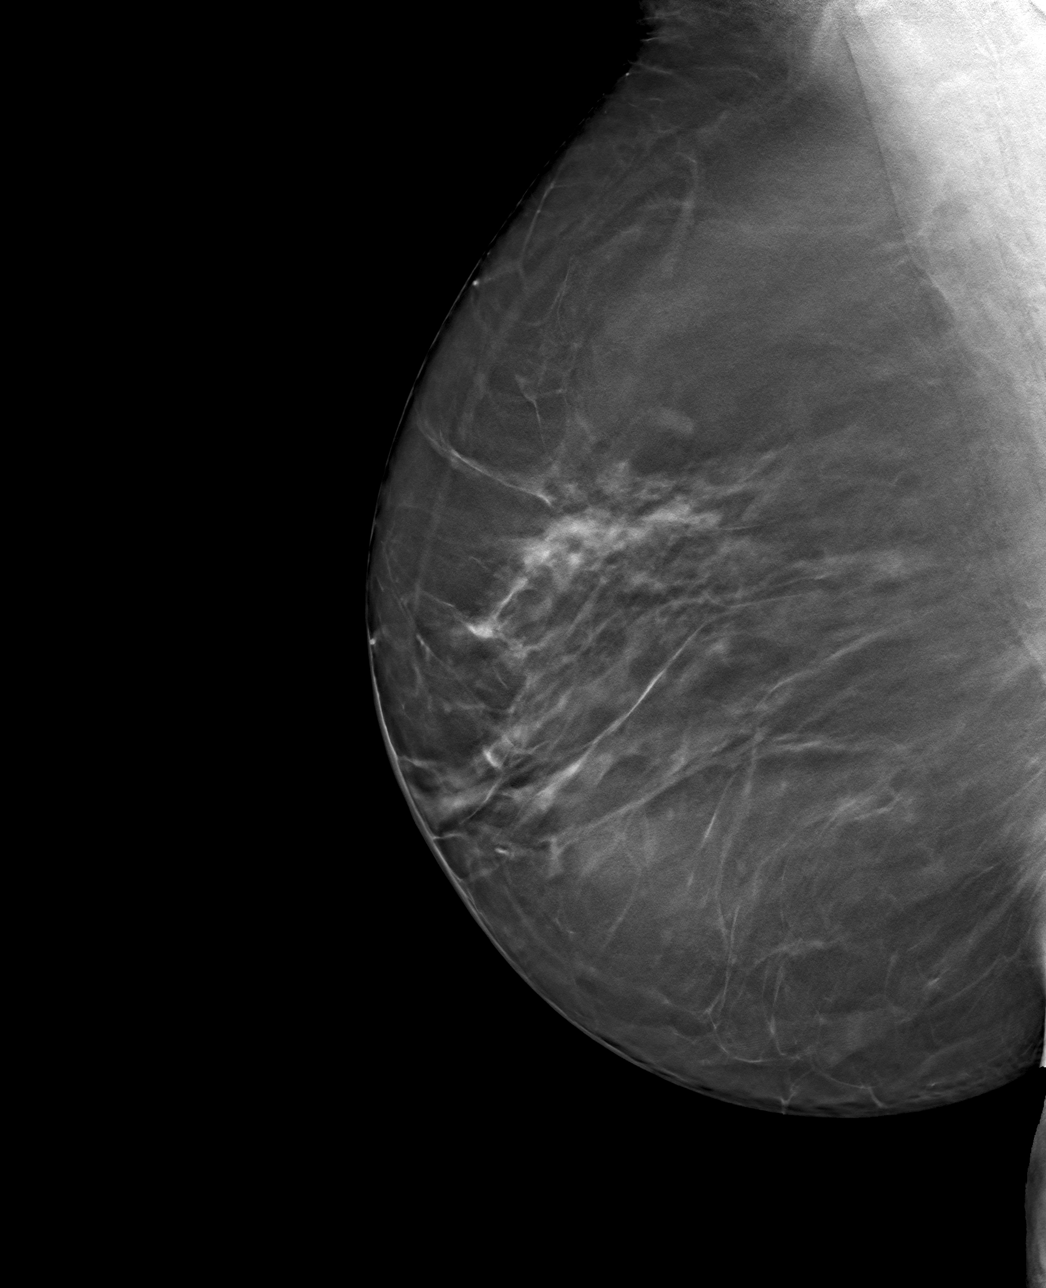

[R CC tomo · tomo slice 47/92.0]
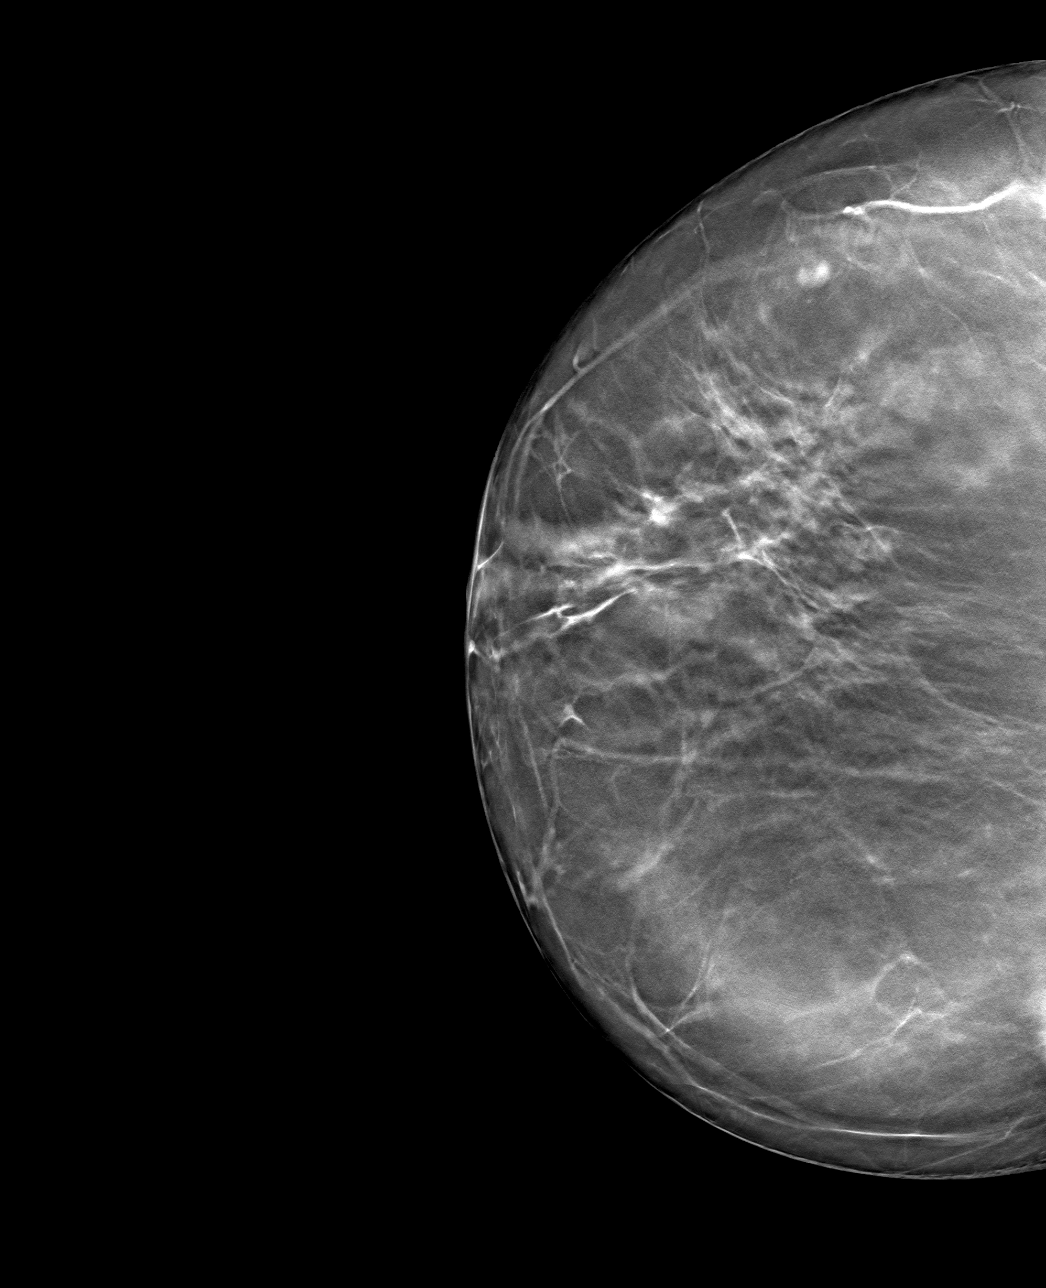

[4 of 12 positions shown; findings below may reference images not displayed]

ACR Breast Density Category b: There are scattered areas of
fibroglandular density.
FINDINGS: There are no findings suspicious for malignancy.
IMPRESSION: No mammographic evidence of malignancy. A result letter of this
screening mammogram will be mailed directly to the patient.

RECOMMENDATION:
Screening mammogram in one year. (Code:C3-I-RW2)

BI-RADS CATEGORY  1: Negative.
